# Patient Record
Sex: Male | Born: 1995 | Race: Black or African American | Hispanic: No | Marital: Single | Smoking: Current every day smoker
Health system: Southern US, Community
[De-identification: ages and names within clinical notes are randomized; demographics above are authoritative.]

---

## 2013-09-25 ENCOUNTER — Encounter (HOSPITAL_COMMUNITY): Payer: Self-pay | Admitting: Emergency Medicine

## 2013-09-25 ENCOUNTER — Emergency Department (HOSPITAL_COMMUNITY)
Admission: EM | Admit: 2013-09-25 | Discharge: 2013-09-25 | Disposition: A | Discharge status: Home / Self Care | Payer: BC Managed Care – PPO | Attending: Emergency Medicine | Admitting: Emergency Medicine

## 2013-09-25 DIAGNOSIS — F10929 Alcohol use, unspecified with intoxication, unspecified: Secondary | ICD-10-CM

## 2013-09-25 DIAGNOSIS — F1010 Alcohol abuse, uncomplicated: Secondary | ICD-10-CM | POA: Insufficient documentation

## 2013-09-25 DIAGNOSIS — F17200 Nicotine dependence, unspecified, uncomplicated: Secondary | ICD-10-CM | POA: Insufficient documentation

## 2013-09-25 MED ORDER — ONDANSETRON HCL 4 MG/2ML IJ SOLN
4.0000 mg | Freq: Once | INTRAMUSCULAR | Status: AC
  Administered 2013-09-25: 4 mg via INTRAVENOUS
  Filled 2013-09-25: qty 2
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
## 2013-09-25 NOTE — ED Notes (Signed)
Pt was able to ambulate w/o difficulty.  MD notified.  [employer] called and will pick-up Pt.
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
## 2013-09-25 NOTE — ED Notes (Signed)
Per EMS, came with complaint of ETOH, pt.is alert and oriented x3 claimed of drinking wine and liquor since 11pm last night . Pt. Was picked up in [employer] [facility]. Denies ay pain  nor SOB.

## 2014-04-02 ENCOUNTER — Ambulatory Visit (INDEPENDENT_AMBULATORY_CARE_PROVIDER_SITE_OTHER): Payer: 59 | Admitting: Emergency Medicine

## 2014-04-02 VITALS — BP 118/86 | HR 70 | Temp 98.3°F | Resp 18 | Ht 67.0 in | Wt 170.0 lb

## 2014-04-02 DIAGNOSIS — S42009A Fracture of unspecified part of unspecified clavicle, initial encounter for closed fracture: Secondary | ICD-10-CM

## 2014-04-02 MED ORDER — HYDROCODONE-ACETAMINOPHEN 5-325 MG PO TABS: 1.0000 | ORAL_TABLET | ORAL | Status: AC | PRN

## 2014-04-02 NOTE — Patient Instructions (Signed)
Clavicle Fracture °A clavicle fracture is a break in the collarbone. This is a common injury, especially in children. Collarbones do not harden until around the age of 20. Most collarbone fractures are treated with a simple arm sling. In some cases a figure-of-eight splint is used to help hold the broken bones in position. Although not often needed, surgery may be required if the bone fragments are not in the correct position (displaced).  °HOME CARE INSTRUCTIONS  °· Apply ice to the injury for 15-20 minutes each hour while awake for 2 days. Put the ice in a plastic bag and place a towel between the bag of ice and your skin. °· Wear the sling or splint constantly for as long as directed by your caregiver. You may remove the sling or splint for bathing or showering. Be sure to keep your shoulder in the same place as when the sling or splint is on. Do not lift your arm. °· If a figure-of-eight splint is applied, it must be tightened by another person every day. Tighten it enough to keep the shoulders held back. Allow enough room to place the index finger between the body and strap. Loosen the splint immediately if you feel numbness or tingling in your hands. °· Only take over-the-counter or prescription medicines for pain, discomfort, or fever as directed by your caregiver. °· Avoid activities that irritate or increase the pain for 4 to 6 weeks after surgery. °· Follow all instructions for follow-up with your caregiver. This includes any referrals, physical therapy, and rehabilitation. Any delay in obtaining necessary care could result in a delay or failure of the injury to heal properly. °SEEK MEDICAL CARE IF:  °You have pain and swelling that are not relieved with medications. °SEEK IMMEDIATE MEDICAL CARE IF:  °Your arm is numb, cold, or pale, even when the splint is loose. °MAKE SURE YOU:  °· Understand these instructions. °· Will watch your condition. °· Will get help right away if you are not doing well or get  worse. °Document Released: 07/16/2005 Document Revised: 12/29/2011 Document Reviewed: 05/11/2008 °ExitCare® Patient Information ©2014 [employer]. ° °

## 2014-04-02 NOTE — Progress Notes (Signed)
[facility] [address], [place] [id number] [phone]  Date:  04/02/2014   Name:  Jeremy CheJonathan H Richard   DOB:  02-Jun-1996   MRN:  [id number]  PCP:  No PCP Per Patient    Chief Complaint: Clavicle Injury   History of Present Illness:  Jeremy CheJonathan H Richard is a 18 y.o. very pleasant male patient who presents with the following:  Playing football yesterday and was injured.  Has pain and inability to use left arm or shoulder.  Taken to  ER and xrayed and splinted treated for fractured clavicle.  ER doctor failed to sign his prescription for pain medication.  No improvement with over the counter medications or other home remedies. Denies other complaint or health concern today.   There are no active problems to display for this patient.   No past medical history on file.  No past surgical history on file.  History  Substance Use Topics  . Smoking status: Current Every Day Smoker -- 0.50 packs/day    Types: Cigarettes  . Smokeless tobacco: Not on file  . Alcohol Use: Yes     Comment: occasional    No family history on file.  No Known Allergies  Medication list has been reviewed and updated.  No current outpatient prescriptions on file prior to visit.   No current facility-administered medications on file prior to visit.    Review of Systems:  As per HPI, otherwise negative.    Physical Examination: Filed Vitals:   04/02/14 1242  BP: 118/86  Pulse: 70  Temp: 98.3 F (36.8 C)  Resp: 18   Filed Vitals:   04/02/14 1242  Height: 5\' 7"  (1.702 m)  Weight: 170 lb (77.111 kg)   Body mass index is 26.62 kg/(m^2). Ideal Body Weight: Weight in (lb) to have BMI = 25: 159.3   GEN: WDWN, NAD, Non-toxic, Alert & Oriented x 3 HEENT: Atraumatic, Normocephalic.  Ears and Nose: No external deformity. EXTR: No clubbing/cyanosis/edema NEURO: Normal gait.  PSYCH: Normally interactive. Conversant. Not depressed or anxious appearing.  Calm demeanor.   LEFT shoulder  Marked pain and guarding.  Left shoulder drop.  Strap incorrectly applied  Assessment and Plan: Left clavicle fracture. vicodin Ortho   Signed,  Phillips OdorJeffery Shreena Baines, MD

## 2018-03-02 ENCOUNTER — Emergency Department (HOSPITAL_COMMUNITY): Payer: 59

## 2018-03-02 ENCOUNTER — Emergency Department (HOSPITAL_COMMUNITY)
Admission: EM | Admit: 2018-03-02 | Discharge: 2018-03-02 | Disposition: A | Discharge status: Home / Self Care | Payer: 59 | Attending: Emergency Medicine | Admitting: Emergency Medicine

## 2018-03-02 ENCOUNTER — Encounter (HOSPITAL_COMMUNITY): Payer: Self-pay | Admitting: Emergency Medicine

## 2018-03-02 DIAGNOSIS — F17210 Nicotine dependence, cigarettes, uncomplicated: Secondary | ICD-10-CM | POA: Insufficient documentation

## 2018-03-02 DIAGNOSIS — M25511 Pain in right shoulder: Secondary | ICD-10-CM | POA: Insufficient documentation

## 2018-03-02 MED ORDER — IBUPROFEN 800 MG PO TABS
800.0000 mg | ORAL_TABLET | Freq: Once | ORAL | Status: AC
  Administered 2018-03-02: 800 mg via ORAL
  Filled 2018-03-02: qty 1
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
